# Patient Record
Sex: Female | Born: 1960 | Race: White | Hispanic: No
Health system: Southern US, Community
[De-identification: ages and names within clinical notes are randomized; demographics above are authoritative.]

---

## 2012-08-31 ENCOUNTER — Other Ambulatory Visit: Payer: Self-pay | Admitting: Family Medicine

## 2012-08-31 DIAGNOSIS — Z1231 Encounter for screening mammogram for malignant neoplasm of breast: Secondary | ICD-10-CM

## 2012-09-20 ENCOUNTER — Ambulatory Visit
Admission: RE | Admit: 2012-09-20 | Discharge: 2012-09-20 | Disposition: A | Discharge status: Home / Self Care | Payer: BC Managed Care – PPO | Source: Ambulatory Visit | Attending: Family Medicine | Admitting: Family Medicine

## 2012-09-20 DIAGNOSIS — Z1231 Encounter for screening mammogram for malignant neoplasm of breast: Secondary | ICD-10-CM

## 2014-04-04 ENCOUNTER — Other Ambulatory Visit: Payer: Self-pay | Admitting: Neurosurgery

## 2014-04-04 DIAGNOSIS — R42 Dizziness and giddiness: Secondary | ICD-10-CM

## 2014-04-04 DIAGNOSIS — S0990XA Unspecified injury of head, initial encounter: Secondary | ICD-10-CM

## 2014-05-14 ENCOUNTER — Ambulatory Visit
Admission: RE | Admit: 2014-05-14 | Discharge: 2014-05-14 | Disposition: A | Discharge status: Home / Self Care | Payer: BLUE CROSS/BLUE SHIELD | Source: Ambulatory Visit | Attending: Neurosurgery | Admitting: Neurosurgery

## 2014-05-14 DIAGNOSIS — S0990XA Unspecified injury of head, initial encounter: Secondary | ICD-10-CM

## 2014-05-14 DIAGNOSIS — R42 Dizziness and giddiness: Secondary | ICD-10-CM

## 2015-06-19 ENCOUNTER — Other Ambulatory Visit: Payer: Self-pay | Admitting: Family Medicine

## 2015-06-19 DIAGNOSIS — E2839 Other primary ovarian failure: Secondary | ICD-10-CM

## 2015-06-19 DIAGNOSIS — Z1231 Encounter for screening mammogram for malignant neoplasm of breast: Secondary | ICD-10-CM

## 2015-07-01 DIAGNOSIS — L080 Pyoderma: Secondary | ICD-10-CM | POA: Diagnosis not present

## 2015-07-11 ENCOUNTER — Ambulatory Visit: Payer: BLUE CROSS/BLUE SHIELD

## 2015-07-11 ENCOUNTER — Inpatient Hospital Stay: Admission: RE | Admit: 2015-07-11 | Payer: BLUE CROSS/BLUE SHIELD | Source: Ambulatory Visit

## 2015-12-23 DIAGNOSIS — J069 Acute upper respiratory infection, unspecified: Secondary | ICD-10-CM | POA: Diagnosis not present

## 2016-02-12 DIAGNOSIS — I10 Essential (primary) hypertension: Secondary | ICD-10-CM | POA: Diagnosis not present

## 2016-02-12 DIAGNOSIS — R7301 Impaired fasting glucose: Secondary | ICD-10-CM | POA: Diagnosis not present

## 2016-02-12 DIAGNOSIS — F411 Generalized anxiety disorder: Secondary | ICD-10-CM | POA: Diagnosis not present

## 2016-02-12 DIAGNOSIS — R748 Abnormal levels of other serum enzymes: Secondary | ICD-10-CM | POA: Diagnosis not present

## 2016-02-13 ENCOUNTER — Other Ambulatory Visit: Payer: Self-pay | Admitting: Family

## 2016-02-13 DIAGNOSIS — E2839 Other primary ovarian failure: Secondary | ICD-10-CM

## 2016-02-13 DIAGNOSIS — Z1231 Encounter for screening mammogram for malignant neoplasm of breast: Secondary | ICD-10-CM

## 2016-02-19 ENCOUNTER — Other Ambulatory Visit: Payer: Self-pay | Admitting: Family

## 2016-02-19 DIAGNOSIS — R748 Abnormal levels of other serum enzymes: Secondary | ICD-10-CM

## 2016-04-20 DIAGNOSIS — L309 Dermatitis, unspecified: Secondary | ICD-10-CM | POA: Diagnosis not present

## 2016-09-23 DIAGNOSIS — R748 Abnormal levels of other serum enzymes: Secondary | ICD-10-CM | POA: Diagnosis not present

## 2016-09-23 DIAGNOSIS — I10 Essential (primary) hypertension: Secondary | ICD-10-CM | POA: Diagnosis not present

## 2016-09-23 DIAGNOSIS — F411 Generalized anxiety disorder: Secondary | ICD-10-CM | POA: Diagnosis not present

## 2016-11-06 ENCOUNTER — Other Ambulatory Visit: Payer: Self-pay | Admitting: Plastic Surgery

## 2016-11-06 ENCOUNTER — Ambulatory Visit
Admission: RE | Admit: 2016-11-06 | Discharge: 2016-11-06 | Disposition: A | Discharge status: Home / Self Care | Payer: BLUE CROSS/BLUE SHIELD | Source: Ambulatory Visit | Attending: Plastic Surgery | Admitting: Plastic Surgery

## 2016-11-06 DIAGNOSIS — S01502A Unspecified open wound of oral cavity, initial encounter: Secondary | ICD-10-CM

## 2016-11-06 DIAGNOSIS — S01402A Unspecified open wound of left cheek and temporomandibular area, initial encounter: Secondary | ICD-10-CM | POA: Diagnosis not present

## 2016-11-11 DIAGNOSIS — L080 Pyoderma: Secondary | ICD-10-CM | POA: Diagnosis not present

## 2016-11-18 DIAGNOSIS — L080 Pyoderma: Secondary | ICD-10-CM | POA: Diagnosis not present

## 2017-01-16 IMAGING — CT CT HEAD W/O CM
1 series · 16 of 27 positions shown, 20 images · non-contrast
Comparison: MRI same day

CLINICAL DATA: Fell in [REDACTED], slipped on ice. Trauma to the back
of the head. Headache and dizziness. Visual disturbance. Speech
disturbance.

EXAM:
CT HEAD WITHOUT CONTRAST
TECHNIQUE: Contiguous axial images were obtained from the base of the skull
through the vertex without intravenous contrast.

[Series 3: head wo 5.0 h31s · axial · 0.45mm/px · z∈[-189,-69]mm · 16 of 27 slices shown, 20 images]
[im 2/27  brain]
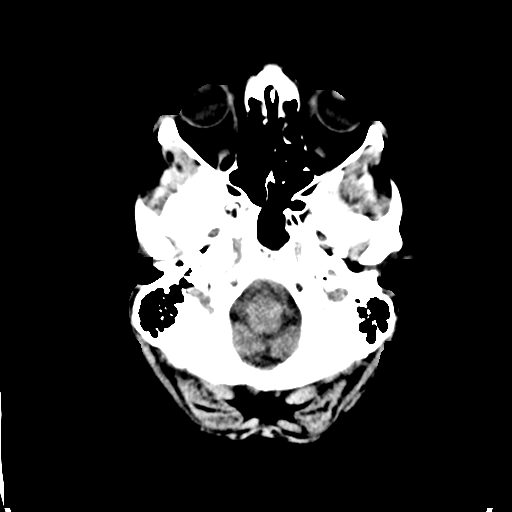
[im 2/27  bone]
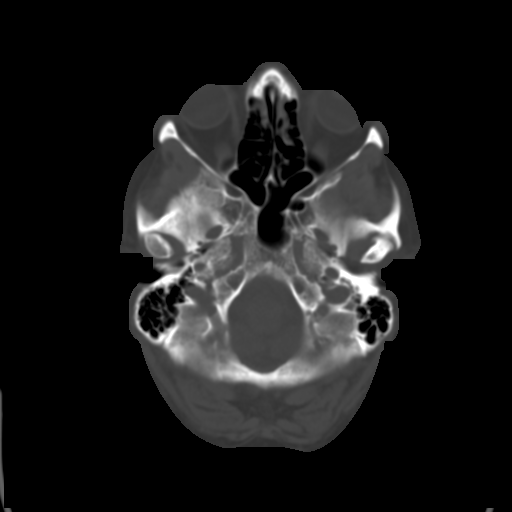
[im 4/27  brain]
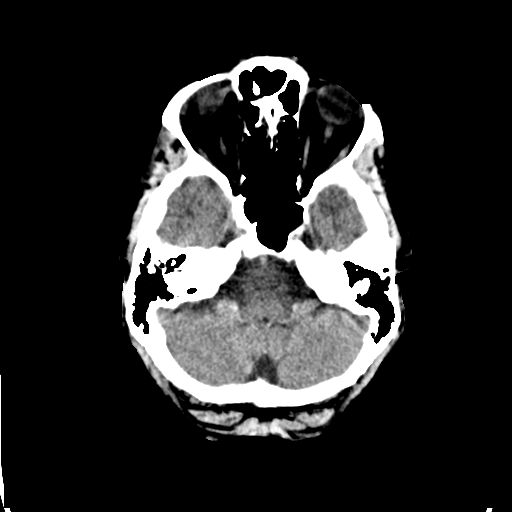
[im 5/27  brain]
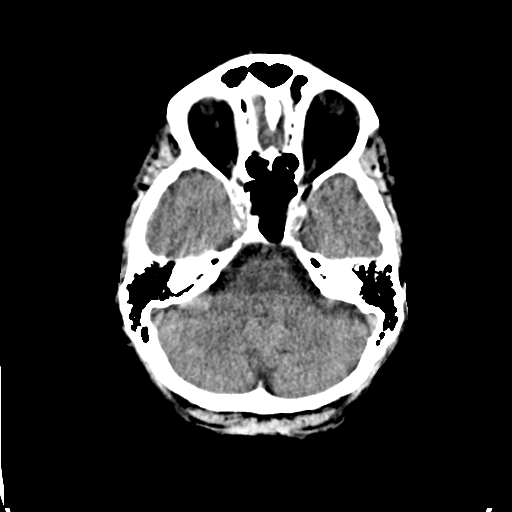
[im 7/27  brain]
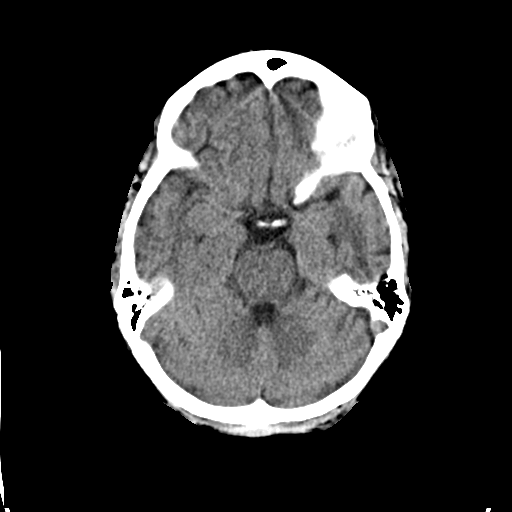
[im 9/27  brain]
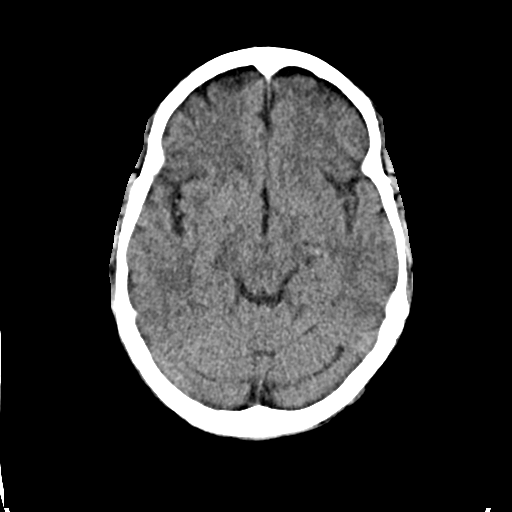
[im 9/27  bone]
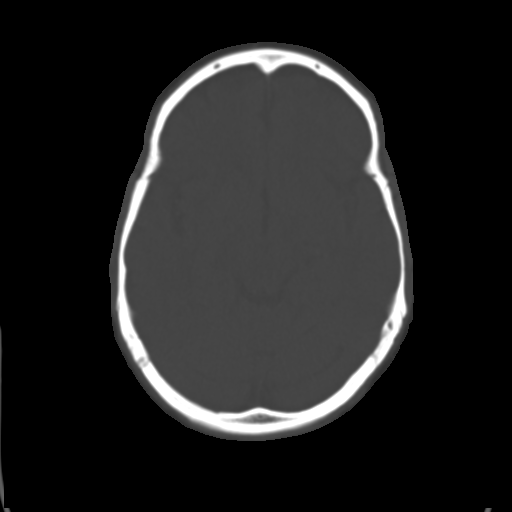
[im 10/27  brain]
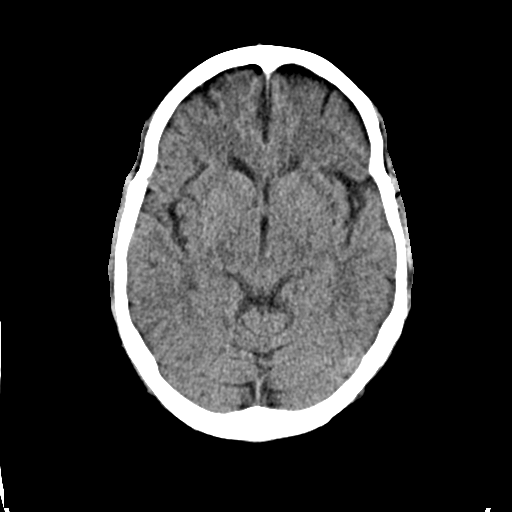
[im 12/27  brain]
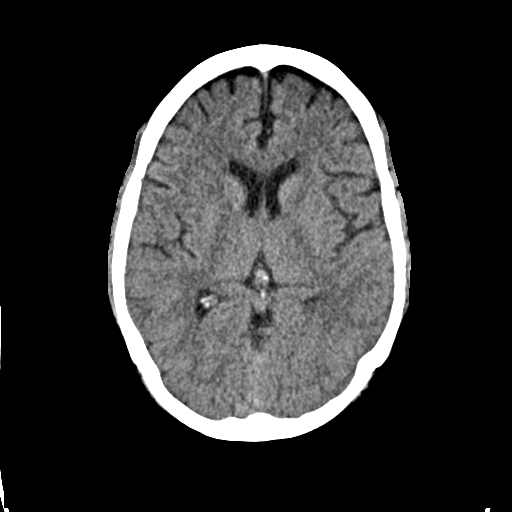
[im 13/27  brain]
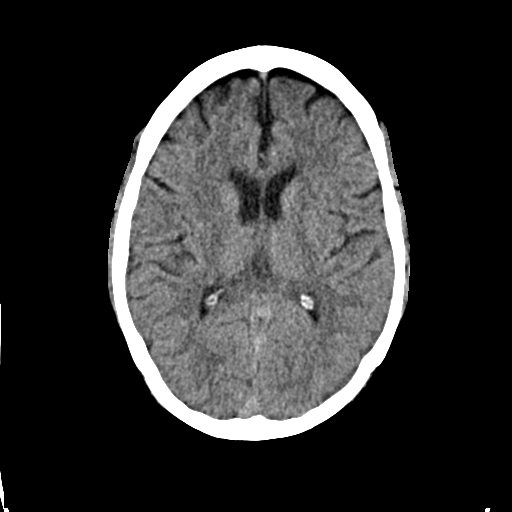
[im 15/27  brain]
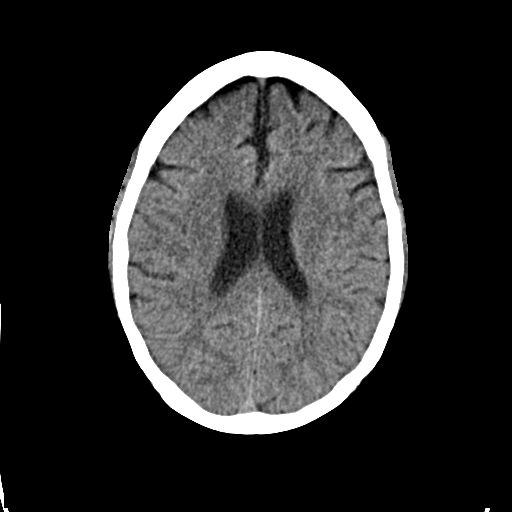
[im 15/27  bone]
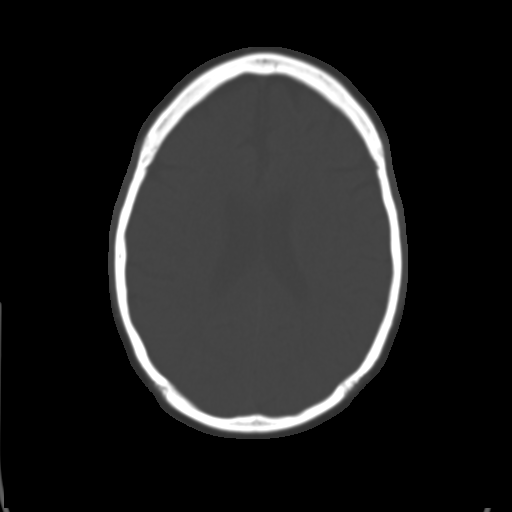
[im 16/27  brain]
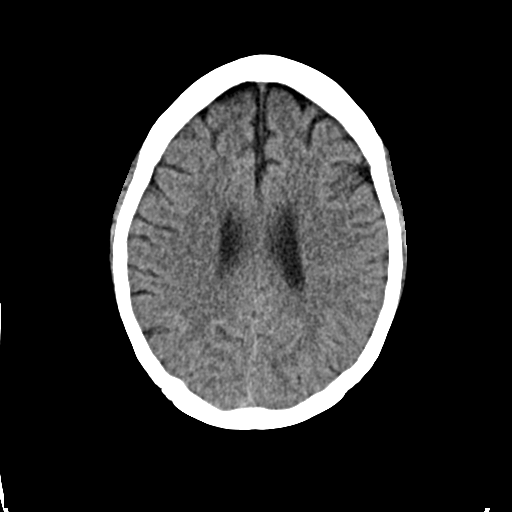
[im 18/27  brain]
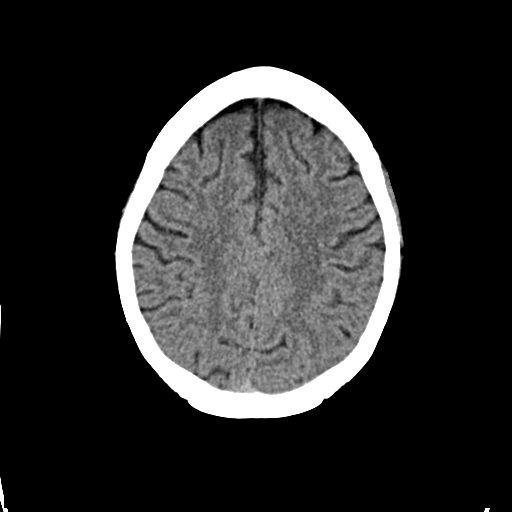
[im 19/27  brain]
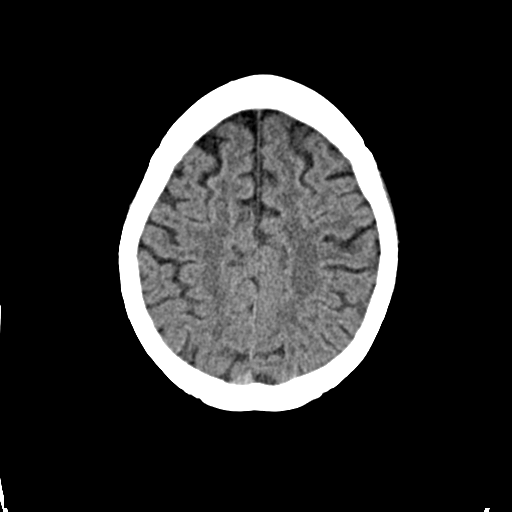
[im 21/27  brain]
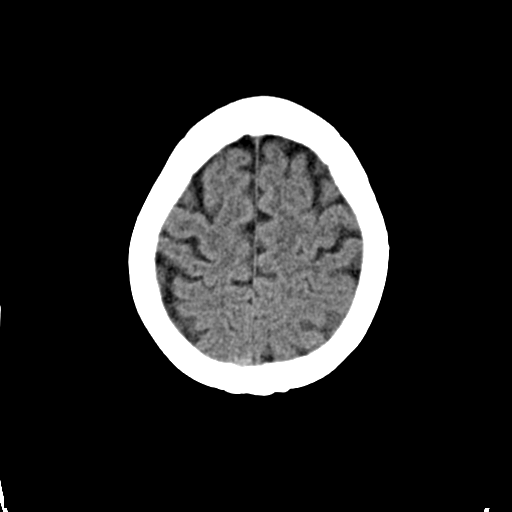
[im 21/27  bone]
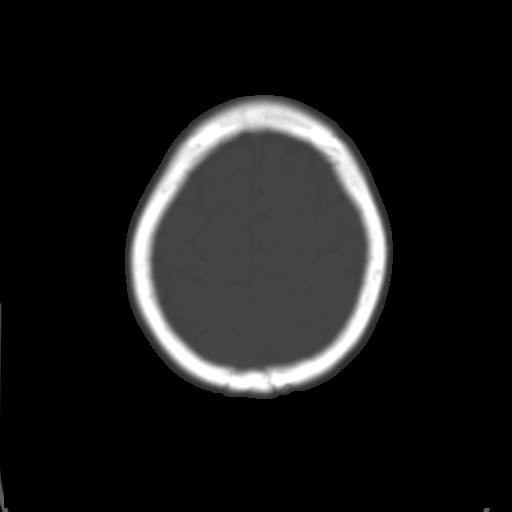
[im 23/27  brain]
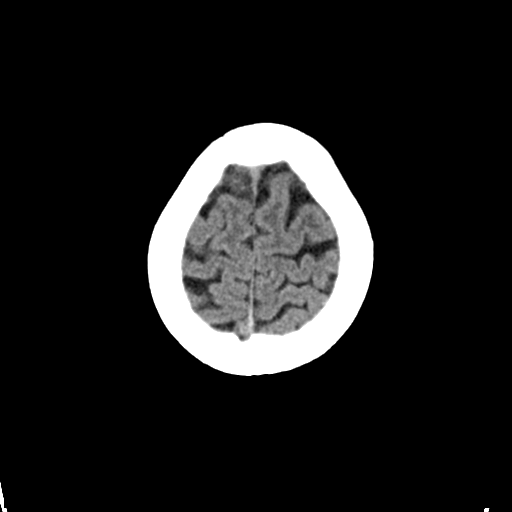
[im 24/27  brain]
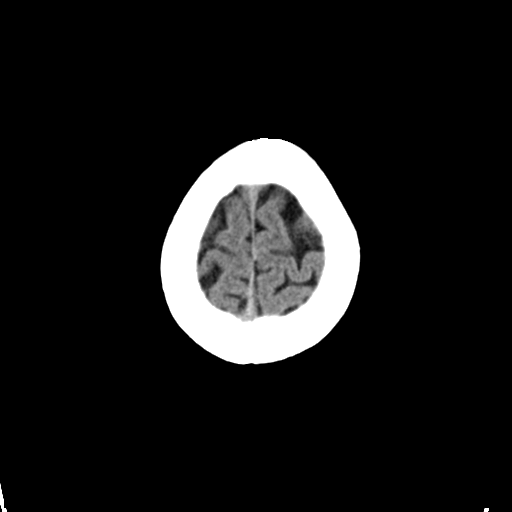
[im 26/27  brain]
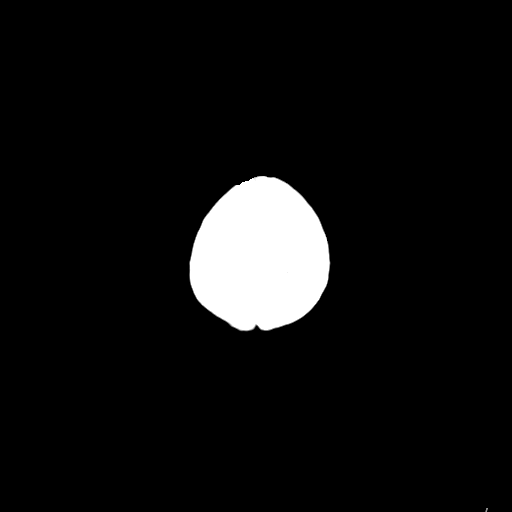

[16 of 27 positions shown; findings below may reference images not displayed]

FINDINGS: The brain has a normal appearance without evidence of atrophy,
infarction, mass lesion, hemorrhage, hydrocephalus or extra-axial
collection. The calvarium is unremarkable. The paranasal sinuses,
middle ears and mastoids are clear.
IMPRESSION: Normal head CT. Minimal left basal ganglia abnormalities at MRI are
not visible by CT. No traumatic finding.

## 2017-01-26 DIAGNOSIS — J069 Acute upper respiratory infection, unspecified: Secondary | ICD-10-CM | POA: Diagnosis not present

## 2017-02-17 ENCOUNTER — Other Ambulatory Visit: Payer: Self-pay | Admitting: Family Medicine

## 2017-02-17 DIAGNOSIS — F411 Generalized anxiety disorder: Secondary | ICD-10-CM | POA: Diagnosis not present

## 2017-02-17 DIAGNOSIS — E785 Hyperlipidemia, unspecified: Secondary | ICD-10-CM | POA: Diagnosis not present

## 2017-02-17 DIAGNOSIS — I10 Essential (primary) hypertension: Secondary | ICD-10-CM | POA: Diagnosis not present

## 2017-02-17 DIAGNOSIS — R748 Abnormal levels of other serum enzymes: Secondary | ICD-10-CM

## 2017-02-17 DIAGNOSIS — R7301 Impaired fasting glucose: Secondary | ICD-10-CM | POA: Diagnosis not present

## 2017-02-25 DIAGNOSIS — I10 Essential (primary) hypertension: Secondary | ICD-10-CM | POA: Diagnosis not present

## 2017-02-25 DIAGNOSIS — E785 Hyperlipidemia, unspecified: Secondary | ICD-10-CM | POA: Diagnosis not present

## 2017-02-25 DIAGNOSIS — R7301 Impaired fasting glucose: Secondary | ICD-10-CM | POA: Diagnosis not present

## 2017-08-31 DIAGNOSIS — R7301 Impaired fasting glucose: Secondary | ICD-10-CM | POA: Diagnosis not present

## 2017-08-31 DIAGNOSIS — I10 Essential (primary) hypertension: Secondary | ICD-10-CM | POA: Diagnosis not present

## 2017-08-31 DIAGNOSIS — G479 Sleep disorder, unspecified: Secondary | ICD-10-CM | POA: Diagnosis not present

## 2017-08-31 DIAGNOSIS — F411 Generalized anxiety disorder: Secondary | ICD-10-CM | POA: Diagnosis not present

## 2017-08-31 DIAGNOSIS — R748 Abnormal levels of other serum enzymes: Secondary | ICD-10-CM | POA: Diagnosis not present

## 2017-12-17 DIAGNOSIS — Z23 Encounter for immunization: Secondary | ICD-10-CM | POA: Diagnosis not present

## 2017-12-17 DIAGNOSIS — L03119 Cellulitis of unspecified part of limb: Secondary | ICD-10-CM | POA: Diagnosis not present

## 2018-02-21 DIAGNOSIS — G479 Sleep disorder, unspecified: Secondary | ICD-10-CM | POA: Diagnosis not present

## 2018-02-21 DIAGNOSIS — I10 Essential (primary) hypertension: Secondary | ICD-10-CM | POA: Diagnosis not present

## 2018-02-21 DIAGNOSIS — F411 Generalized anxiety disorder: Secondary | ICD-10-CM | POA: Diagnosis not present

## 2018-02-21 DIAGNOSIS — F324 Major depressive disorder, single episode, in partial remission: Secondary | ICD-10-CM | POA: Diagnosis not present

## 2018-04-28 DIAGNOSIS — L011 Impetiginization of other dermatoses: Secondary | ICD-10-CM | POA: Diagnosis not present

## 2018-04-28 DIAGNOSIS — L089 Local infection of the skin and subcutaneous tissue, unspecified: Secondary | ICD-10-CM | POA: Diagnosis not present

## 2018-04-28 DIAGNOSIS — L309 Dermatitis, unspecified: Secondary | ICD-10-CM | POA: Diagnosis not present

## 2018-08-18 DIAGNOSIS — F411 Generalized anxiety disorder: Secondary | ICD-10-CM | POA: Diagnosis not present

## 2018-08-18 DIAGNOSIS — R7301 Impaired fasting glucose: Secondary | ICD-10-CM | POA: Diagnosis not present

## 2018-08-18 DIAGNOSIS — E785 Hyperlipidemia, unspecified: Secondary | ICD-10-CM | POA: Diagnosis not present

## 2018-08-18 DIAGNOSIS — I10 Essential (primary) hypertension: Secondary | ICD-10-CM | POA: Diagnosis not present

## 2019-02-07 DIAGNOSIS — I10 Essential (primary) hypertension: Secondary | ICD-10-CM | POA: Diagnosis not present

## 2019-02-07 DIAGNOSIS — R7301 Impaired fasting glucose: Secondary | ICD-10-CM | POA: Diagnosis not present

## 2019-02-07 DIAGNOSIS — G4700 Insomnia, unspecified: Secondary | ICD-10-CM | POA: Diagnosis not present

## 2019-02-07 DIAGNOSIS — F324 Major depressive disorder, single episode, in partial remission: Secondary | ICD-10-CM | POA: Diagnosis not present

## 2019-03-31 DIAGNOSIS — R7301 Impaired fasting glucose: Secondary | ICD-10-CM | POA: Diagnosis not present

## 2019-03-31 DIAGNOSIS — I10 Essential (primary) hypertension: Secondary | ICD-10-CM | POA: Diagnosis not present

## 2019-05-15 ENCOUNTER — Other Ambulatory Visit: Payer: Self-pay | Admitting: Family Medicine

## 2019-05-15 DIAGNOSIS — Z1231 Encounter for screening mammogram for malignant neoplasm of breast: Secondary | ICD-10-CM

## 2019-07-12 IMAGING — CR DG SINUSES 1-2V
2 series · 2 of 2 positions shown · non-contrast
Comparison: Brain MRI 05/14/2014, head CT 05/14/2014.

CLINICAL DATA: 56-year-old female with left cheek open wound. Query
maxillary sinus involvement.

EXAM:
PARANASAL SINUSES - 1-2 VIEW

[w waters pa]
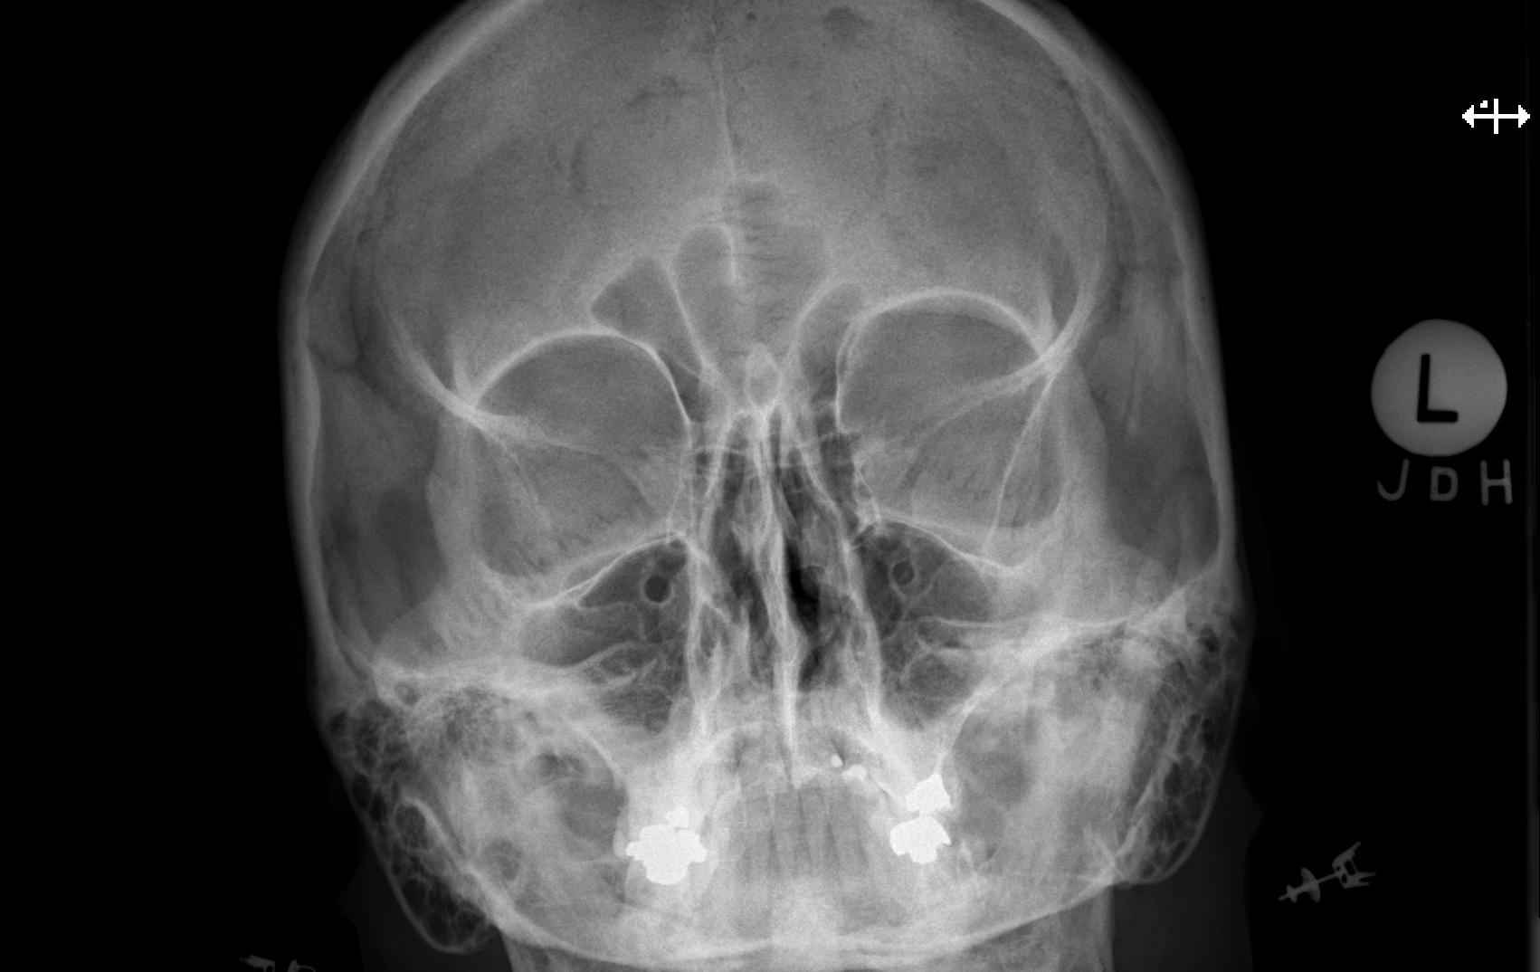

[w skull lat]
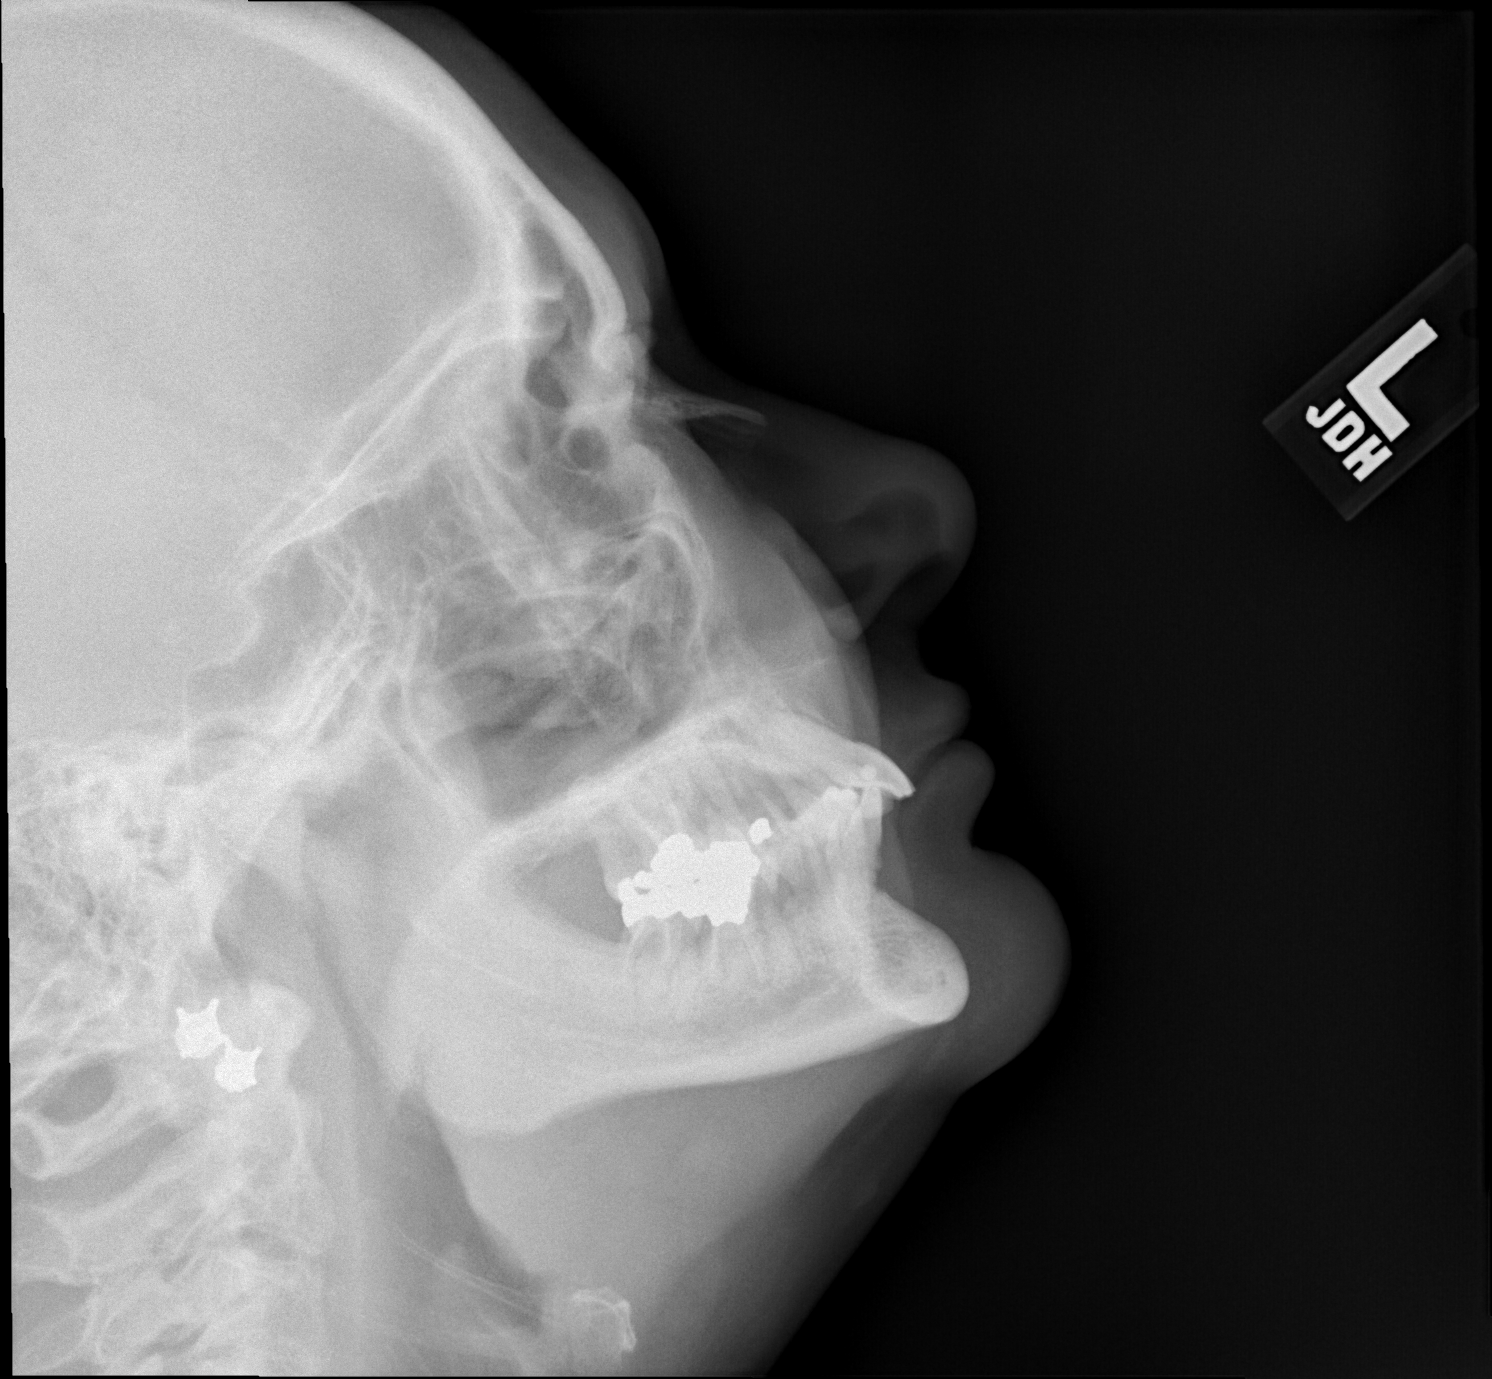

[2 of 2 positions shown; findings below may reference images not displayed]

FINDINGS: AP and lateral views of the face. Bone mineralization is within
normal limits. Bilateral paranasal sinus pneumatization appears
normal, including both maxillary sinuses. No maxilla abnormality is
evident radiographically.
IMPRESSION: Maxillary sinuses appear clear, with no radiographic facial
abnormality identified.

## 2019-11-09 DIAGNOSIS — G479 Sleep disorder, unspecified: Secondary | ICD-10-CM | POA: Diagnosis not present

## 2019-11-09 DIAGNOSIS — I10 Essential (primary) hypertension: Secondary | ICD-10-CM | POA: Diagnosis not present

## 2019-11-09 DIAGNOSIS — R7301 Impaired fasting glucose: Secondary | ICD-10-CM | POA: Diagnosis not present

## 2019-11-09 DIAGNOSIS — R748 Abnormal levels of other serum enzymes: Secondary | ICD-10-CM | POA: Diagnosis not present

## 2020-05-17 DIAGNOSIS — I10 Essential (primary) hypertension: Secondary | ICD-10-CM | POA: Diagnosis not present

## 2020-05-17 DIAGNOSIS — G4700 Insomnia, unspecified: Secondary | ICD-10-CM | POA: Diagnosis not present

## 2020-05-17 DIAGNOSIS — R7301 Impaired fasting glucose: Secondary | ICD-10-CM | POA: Diagnosis not present

## 2020-05-17 DIAGNOSIS — F324 Major depressive disorder, single episode, in partial remission: Secondary | ICD-10-CM | POA: Diagnosis not present

## 2020-11-04 ENCOUNTER — Ambulatory Visit (INDEPENDENT_AMBULATORY_CARE_PROVIDER_SITE_OTHER): Payer: BC Managed Care – PPO | Admitting: Podiatry

## 2020-11-04 ENCOUNTER — Other Ambulatory Visit: Payer: Self-pay | Admitting: Podiatry

## 2020-11-04 ENCOUNTER — Encounter: Payer: Self-pay | Admitting: Podiatry

## 2020-11-04 ENCOUNTER — Ambulatory Visit (INDEPENDENT_AMBULATORY_CARE_PROVIDER_SITE_OTHER): Payer: BC Managed Care – PPO

## 2020-11-04 ENCOUNTER — Other Ambulatory Visit: Payer: Self-pay

## 2020-11-04 DIAGNOSIS — S99912A Unspecified injury of left ankle, initial encounter: Secondary | ICD-10-CM

## 2020-11-04 DIAGNOSIS — S82892A Other fracture of left lower leg, initial encounter for closed fracture: Secondary | ICD-10-CM | POA: Diagnosis not present

## 2020-11-04 DIAGNOSIS — S99922A Unspecified injury of left foot, initial encounter: Secondary | ICD-10-CM

## 2020-11-04 DIAGNOSIS — S92345A Nondisplaced fracture of fourth metatarsal bone, left foot, initial encounter for closed fracture: Secondary | ICD-10-CM | POA: Diagnosis not present

## 2020-11-04 NOTE — Patient Instructions (Signed)
Ankle Sprain, Phase I Rehab An ankle sprain is an injury to the ligaments of your ankle. Ankle sprains cause stiffness, loss of motion, and loss of strength. Ask your health care provider which exercises are safe for you. Do exercises exactly as told by your health care provider and adjust them as directed. It is normal to feel mild stretching, pulling, tightness, or discomfort as you do these exercises. Stop right away if you feel sudden pain or your pain gets worse. Do not begin these exercises until told by your health care provider. Stretching and range-of-motion exercises These exercises warm up your muscles and joints and improve the movement and flexibility of your lower leg and ankle. These exercises also help to relieve pain and stiffness. Gastroc and soleus stretch This exercise is also called a calf stretch. It stretches the muscles in the back of the lower leg. These muscles are the gastrocnemius, or gastroc, and the soleus. Sit on the floor with your left / right leg extended. Loop a belt or towel around the ball of your left / right foot. The ball of your foot is on the walking surface, right under your toes. Keep your left / right ankle and foot relaxed and keep your knee straight while you use the belt or towel to pull your foot toward you. You should feel a gentle stretch behind your calf or knee in your gastroc muscle. Hold this position for __________ seconds, then release to the starting position. Repeat the exercise with your knee bent. You can put a pillow or a rolled bath towel under your knee to support it. You should feel a stretch deep in your calf in the soleus muscle or at your Achilles tendon. Repeat __________ times. Complete this exercise __________ times a day. Ankle alphabet  Sit with your left / right leg supported at the lower leg. Do not rest your foot on anything. Make sure your foot has room to move freely. Think of your left / right foot as a paintbrush. Move  your foot to trace each letter of the alphabet in the air. Keep your hip and knee still while you trace. Make the letters as large as you can without feeling discomfort. Trace every letter from A to Z. Repeat __________ times. Complete this exercise __________ times a day. Strengthening exercises These exercises build strength and endurance in your ankle and lower leg. Endurance is the ability to use your muscles for a long time, even after they get tired. Ankle dorsiflexion  Secure a rubber exercise band or tube to an object, such as a table leg, that will stay still when the band is pulled. Secure the other end around your left / right foot. Sit on the floor facing the object, with your left / right leg extended. The band or tube should be slightly tense when your foot is relaxed. Slowly bring your foot toward you, bringing the top of your foot toward your shin (dorsiflexion), and pulling the band tighter. Hold this position for __________ seconds. Slowly return your foot to the starting position. Repeat __________ times. Complete this exercise __________ times a day. Ankle plantar flexion  Sit on the floor with your left / right leg extended. Loop a rubber exercise tube or band around the ball of your left / right foot. The ball of your foot is on the walking surface, right under your toes. Hold the ends of the band or tube in your hands. The band or tube should be slightly   tense when your foot is relaxed. Slowly point your foot and toes downward to tilt the top of your foot away from your shin (plantar flexion). Hold this position for __________ seconds. Slowly return your foot to the starting position. Repeat __________ times. Complete this exercise __________ times a day. Ankle eversion Sit on the floor with your legs straight out in front of you. Loop a rubber exercise band or tube around the ball of your left / right foot. The ball of your foot is on the walking surface, right under  your toes. Hold the ends of the band in your hands, or secure the band to a stable object. The band or tube should be slightly tense when your foot is relaxed. Slowly push your foot outward, away from your other leg (eversion). Hold this position for __________ seconds. Slowly return your foot to the starting position. Repeat __________ times. Complete this exercise __________ times a day. This information is not intended to replace advice given to you by your health care provider. Make sure you discuss any questions you have with your health care provider. Document Revised: 02/15/2020 Document Reviewed: 02/15/2020 [employer] Patient Education  2022 [employer]  

## 2020-11-06 NOTE — Progress Notes (Signed)
Subjective:   Patient ID: Colleen Massey, female   DOB: 60 y.o.   MRN: [id number]   HPI 60 year old female presents the office today for evaluation of left ankle, foot fracture.  She states about 4 weeks ago she tripped on a bath mat injuring her ankle.  She had bruising and swelling.  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  She is placed into a cam boot.  She states that she is feeling much better.  Some swelling to the ankle.  No redness or warmth.  She has no other concerns.   Review of Systems  All other systems reviewed and are negative.  History reviewed. No pertinent past medical history.  History reviewed. No pertinent surgical history.   Current Outpatient Medications:    escitalopram (LEXAPRO) 20 MG tablet, Take by mouth., Disp: , Rfl:    hydrochlorothiazide (HYDRODIURIL) 25 MG tablet, Take by mouth., Disp: , Rfl:    zolpidem (AMBIEN) 5 MG tablet, Take by mouth., Disp: , Rfl:   Allergies  Allergen Reactions   Codeine Hives   Penicillin G Hives          Objective:  Physical Exam  General: AAO x3, NAD  Dermatological: Skin is warm, dry and supple bilateral.  There are no open sores, no preulcerative lesions, no rash or signs of infection present.  Vascular: Dorsalis Pedis artery and Posterior Tibial artery pedal pulses are 2/4 bilateral with immedate capillary fill time.  There is no pain with calf compression, swelling, warmth, erythema.   Neruologic: Grossly intact via light touch bilateral.   Musculoskeletal: Today there is minimal swelling to the ankle.  There is no tenderness palpation on the proximal tib-fib or along the distal tib-fib.  No pain to the dorsal aspect of the foot or any other areas of the foot.  There is no erythema or warmth.  Flexor, extensor tendons appear to be intact.  In the deep fascia was  tried.  Gait: Unassisted, Nonantalgic.       Assessment:   Healing avulsion fracture left ankle, fourth metatarsal fracture    Plan:  -Treatment options discussed including all alternatives, risks, and complications -X-rays were obtained and reviewed with the patient.  Likely healing fracture noted on the distal fibula but no other areas of acute fracture noted. -At this point she is doing much better not having any pain.  We will transition back out of the cam boot into a regular shoe.  Dispensed a compression anklet as well.  Continue ice and elevate.  Vivi Barrack DPM

## 2020-11-19 DIAGNOSIS — E785 Hyperlipidemia, unspecified: Secondary | ICD-10-CM | POA: Diagnosis not present

## 2020-11-19 DIAGNOSIS — G4700 Insomnia, unspecified: Secondary | ICD-10-CM | POA: Diagnosis not present

## 2020-11-19 DIAGNOSIS — I10 Essential (primary) hypertension: Secondary | ICD-10-CM | POA: Diagnosis not present

## 2020-11-19 DIAGNOSIS — F324 Major depressive disorder, single episode, in partial remission: Secondary | ICD-10-CM | POA: Diagnosis not present

## 2022-08-31 DIAGNOSIS — F411 Generalized anxiety disorder: Secondary | ICD-10-CM | POA: Diagnosis not present

## 2022-08-31 DIAGNOSIS — I10 Essential (primary) hypertension: Secondary | ICD-10-CM | POA: Diagnosis not present

## 2022-08-31 DIAGNOSIS — F324 Major depressive disorder, single episode, in partial remission: Secondary | ICD-10-CM | POA: Diagnosis not present

## 2022-08-31 DIAGNOSIS — Z6826 Body mass index (BMI) 26.0-26.9, adult: Secondary | ICD-10-CM | POA: Diagnosis not present

## 2022-08-31 DIAGNOSIS — R7301 Impaired fasting glucose: Secondary | ICD-10-CM | POA: Diagnosis not present
